# Patient Record
Sex: Female | Born: 1990 | Race: White | Hispanic: No | Marital: Single
Health system: Southern US, Community
[De-identification: ages and names within clinical notes are randomized; demographics above are authoritative.]

---

## 2011-05-05 ENCOUNTER — Emergency Department: Payer: Self-pay | Admitting: Emergency Medicine

## 2011-05-12 ENCOUNTER — Ambulatory Visit: Payer: Self-pay

## 2011-05-12 LAB — URINALYSIS, COMPLETE
Bilirubin,UR: NEGATIVE
Nitrite: NEGATIVE
Protein: NEGATIVE

## 2011-05-12 LAB — CBC WITH DIFFERENTIAL/PLATELET
Basophil #: 0 10*3/uL (ref 0.0–0.1)
HCT: 39.7 % (ref 35.0–47.0)
HGB: 12.8 g/dL (ref 12.0–16.0)
Lymphocyte #: 3 10*3/uL (ref 1.0–3.6)
Lymphocyte %: 22.9 %
Monocyte #: 0.7 x10 3/mm (ref 0.2–0.9)
WBC: 12.9 10*3/uL — ABNORMAL HIGH (ref 3.6–11.0)

## 2011-05-12 LAB — BASIC METABOLIC PANEL
Anion Gap: 10 (ref 7–16)
Calcium, Total: 9.3 mg/dL (ref 9.0–10.7)
Chloride: 101 mmol/L (ref 98–107)
Co2: 27 mmol/L (ref 21–32)
Osmolality: 276 (ref 275–301)
Sodium: 138 mmol/L (ref 136–145)

## 2011-05-12 LAB — LIPASE, BLOOD: Lipase: 195 U/L (ref 73–393)

## 2011-05-14 LAB — URINE CULTURE

## 2011-06-26 ENCOUNTER — Emergency Department: Payer: Self-pay | Admitting: Emergency Medicine

## 2011-06-26 LAB — COMPREHENSIVE METABOLIC PANEL
Anion Gap: 10 (ref 7–16)
BUN: 17 mg/dL (ref 7–18)
Creatinine: 0.75 mg/dL (ref 0.60–1.30)
EGFR (African American): >60
EGFR (Non-African Amer.): >60
Glucose: 118 mg/dL — ABNORMAL HIGH (ref 65–99)
Osmolality: 282 (ref 275–301)
SGOT(AST): 30 U/L — ABNORMAL HIGH (ref 0–26)
Sodium: 140 mmol/L (ref 136–145)

## 2011-06-26 LAB — URINALYSIS, COMPLETE
Bilirubin,UR: NEGATIVE
Blood: NEGATIVE
Ph: 5 (ref 4.5–8.0)
RBC,UR: 4 /HPF (ref 0–5)
Specific Gravity: 1.026 (ref 1.003–1.030)
WBC UR: 3 /HPF (ref 0–5)

## 2011-06-26 LAB — CBC
HCT: 36.6 % (ref 35.0–47.0)
HGB: 11.5 g/dL — ABNORMAL LOW (ref 12.0–16.0)
MCH: 24.6 pg — ABNORMAL LOW (ref 26.0–34.0)
MCHC: 31.5 g/dL — ABNORMAL LOW (ref 32.0–36.0)
MCV: 78 fL — ABNORMAL LOW (ref 80–100)
Platelet: 359 10*3/uL (ref 150–440)
RBC: 4.68 10*6/uL (ref 3.80–5.20)
RDW: 14.7 % — ABNORMAL HIGH (ref 11.5–14.5)
WBC: 17.6 10*3/uL — ABNORMAL HIGH (ref 3.6–11.0)

## 2011-06-26 LAB — HCG, QUANTITATIVE, PREGNANCY: Beta Hcg, Quant.: <1 m[IU]/mL — ABNORMAL LOW

## 2011-06-27 ENCOUNTER — Emergency Department: Payer: Self-pay | Admitting: Emergency Medicine

## 2011-09-16 ENCOUNTER — Ambulatory Visit: Payer: Self-pay | Admitting: Family Medicine

## 2011-10-19 ENCOUNTER — Emergency Department: Payer: Self-pay | Admitting: Emergency Medicine

## 2011-10-19 LAB — CBC
HCT: 37.9 % (ref 35.0–47.0)
HGB: 12.6 g/dL (ref 12.0–16.0)
MCH: 26.3 pg (ref 26.0–34.0)
MCHC: 33.3 g/dL (ref 32.0–36.0)
MCV: 79 fL — ABNORMAL LOW (ref 80–100)
Platelet: 314 10*3/uL (ref 150–440)

## 2011-10-20 LAB — URINALYSIS, COMPLETE
Bilirubin,UR: NEGATIVE
Blood: NEGATIVE
Glucose,UR: NEGATIVE mg/dL (ref 0–75)
Leukocyte Esterase: NEGATIVE
Nitrite: NEGATIVE
Ph: 5 (ref 4.5–8.0)
Protein: NEGATIVE
WBC UR: 1 /HPF (ref 0–5)

## 2011-10-20 LAB — COMPREHENSIVE METABOLIC PANEL
Albumin: 3.3 g/dL — ABNORMAL LOW (ref 3.8–5.6)
Anion Gap: 9 (ref 7–16)
Bilirubin,Total: 0.2 mg/dL (ref 0.2–1.0)
Co2: 24 mmol/L (ref 21–32)
Creatinine: 0.51 mg/dL — ABNORMAL LOW (ref 0.60–1.30)
Glucose: 96 mg/dL (ref 65–99)
Potassium: 3.4 mmol/L — ABNORMAL LOW (ref 3.5–5.1)
SGPT (ALT): 15 U/L (ref 12–78)
Sodium: 138 mmol/L (ref 136–145)
Total Protein: 7.4 g/dL (ref 6.4–8.6)

## 2011-10-20 LAB — HCG, QUANTITATIVE, PREGNANCY: Beta Hcg, Quant.: 52265 m[IU]/mL — ABNORMAL HIGH

## 2012-03-30 ENCOUNTER — Observation Stay: Payer: Self-pay | Admitting: Obstetrics and Gynecology

## 2012-03-30 LAB — PIH PROFILE
Anion Gap: 4 — ABNORMAL LOW (ref 7–16)
BUN: 9 mg/dL (ref 7–18)
Calcium, Total: 8.3 mg/dL — ABNORMAL LOW (ref 8.5–10.1)
Creatinine: 0.73 mg/dL (ref 0.60–1.30)
EGFR (African American): >60
EGFR (Non-African Amer.): >60
HCT: 34.6 % — ABNORMAL LOW (ref 35.0–47.0)
HGB: 11.4 g/dL — ABNORMAL LOW (ref 12.0–16.0)
MCH: 27.9 pg (ref 26.0–34.0)
MCV: 85 fL (ref 80–100)
Potassium: 3.8 mmol/L (ref 3.5–5.1)
SGOT(AST): 13 U/L — ABNORMAL LOW (ref 15–37)
Sodium: 138 mmol/L (ref 136–145)
Uric Acid: 6.1 mg/dL — ABNORMAL HIGH (ref 2.6–6.0)
WBC: 12.5 10*3/uL — ABNORMAL HIGH (ref 3.6–11.0)

## 2012-03-30 LAB — PROTEIN / CREATININE RATIO, URINE
Creatinine, Urine: 206.1 mg/dL — ABNORMAL HIGH (ref 30.0–125.0)
Protein, Random Urine: 56 mg/dL — ABNORMAL HIGH (ref 0–12)
Protein/Creat. Ratio: 272 mg/gCREAT — ABNORMAL HIGH (ref 0–200)

## 2012-04-01 ENCOUNTER — Observation Stay: Payer: Self-pay | Admitting: Obstetrics & Gynecology

## 2012-04-01 LAB — PROTEIN, URINE, 24 HOUR
Protein, 24 Hour Urine: 472 mg/24HR — ABNORMAL HIGH (ref 30–149)
Protein, Urine: 39 mg/dL (ref 0–12)
Total Volume: 1210 mL

## 2012-04-03 ENCOUNTER — Observation Stay: Payer: Self-pay | Admitting: Obstetrics & Gynecology

## 2012-04-05 LAB — PROTEIN, URINE, 24 HOUR
Collection Hours: 24 hours
Protein, 24 Hour Urine: 533 mg/24HR — ABNORMAL HIGH (ref 30–149)
Protein, Urine: 48 mg/dL (ref 0–12)
Total Volume: 1110 mL

## 2012-04-08 ENCOUNTER — Observation Stay: Payer: Self-pay | Admitting: Obstetrics and Gynecology

## 2012-04-22 ENCOUNTER — Inpatient Hospital Stay: Payer: Self-pay | Admitting: Obstetrics and Gynecology

## 2012-04-22 LAB — PIH PROFILE
Anion Gap: 8
BUN: 16 mg/dL
Calcium, Total: 8.5 mg/dL
Chloride: 106 mmol/L
Co2: 22 mmol/L
Creatinine: 0.56 mg/dL — ABNORMAL LOW
EGFR (African American): >60
EGFR (Non-African Amer.): >60
Glucose: 78 mg/dL
HCT: 35.9 %
HGB: 12.3 g/dL
MCH: 29.3 pg
MCHC: 34.3 g/dL
MCV: 85 fL
Osmolality: 272
Platelet: 271 x10 3/mm 3
Potassium: 4.3 mmol/L
RBC: 4.2 X10 6/mm 3
RDW: 14.1 %
SGOT(AST): 23 U/L
Sodium: 136 mmol/L
Uric Acid: 7 mg/dL — ABNORMAL HIGH
WBC: 14.1 x10 3/mm 3 — ABNORMAL HIGH

## 2012-04-23 LAB — HEMATOCRIT: HCT: 36.1 % (ref 35.0–47.0)

## 2012-08-28 ENCOUNTER — Ambulatory Visit: Payer: Self-pay | Admitting: Emergency Medicine

## 2013-06-16 ENCOUNTER — Ambulatory Visit: Payer: Self-pay

## 2014-04-20 IMAGING — US US OB < 14 WEEKS - US OB TV
1 series · 14 of 28 positions shown · non-contrast
Comparison: none

REASON FOR EXAM: CR [id number] 2-3 wks  preg positive  blood in Urine Abd
and pelvic pain
COMMENTS:

[Series 1: us ob < 14 weeks - us ob tv · 0.26mm/px · 39 acquisitions, 14 frames shown]
[im 2/39]
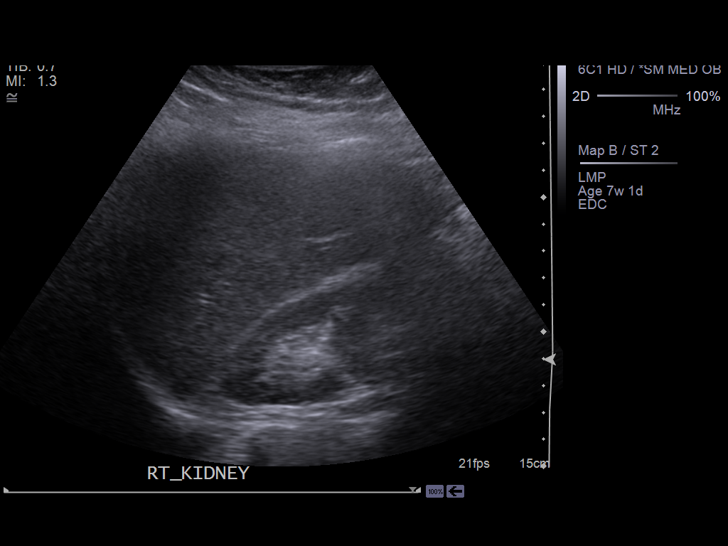
[im 5/39]
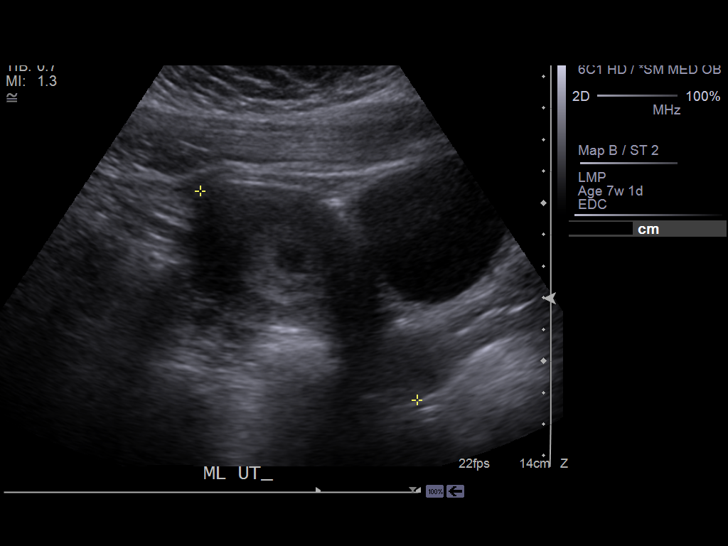
[im 8/39]
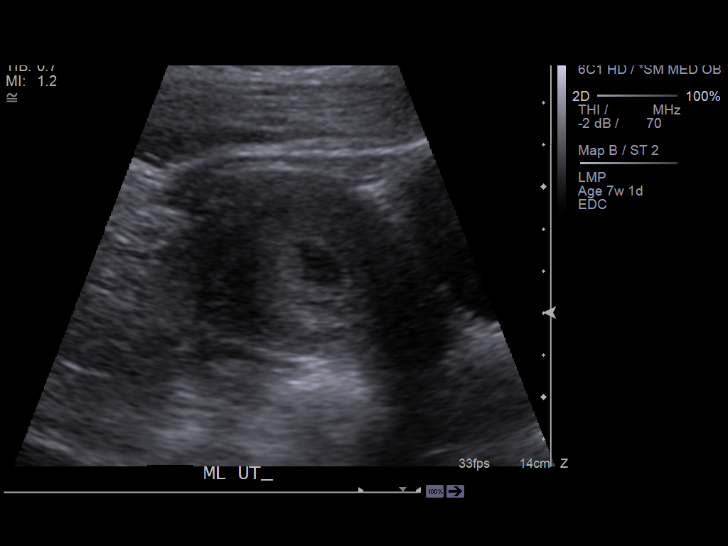
[im 10/39]
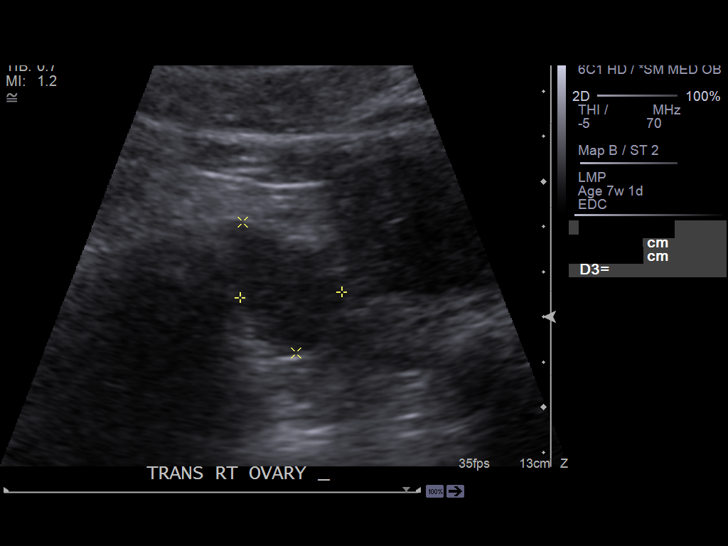
[im 13/39]
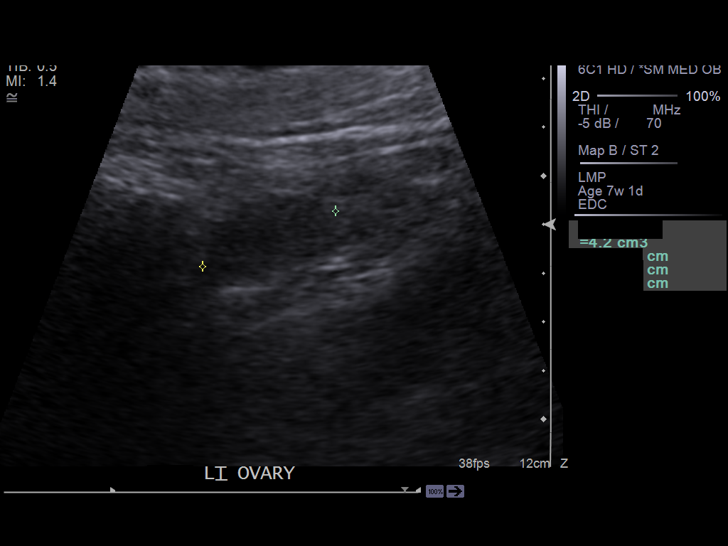
[im 16/39]
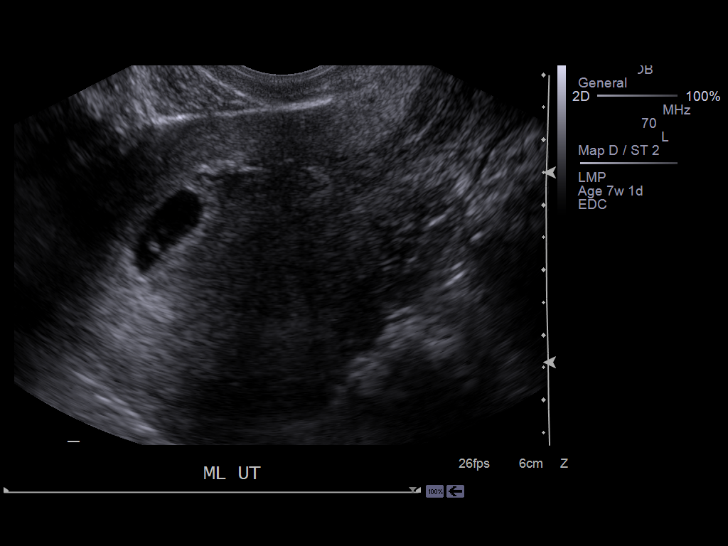
[im 19/39]
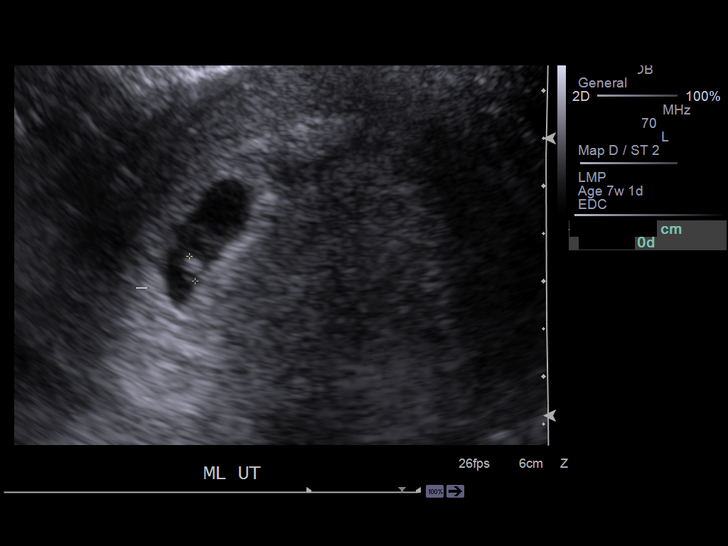
[im 22/39]
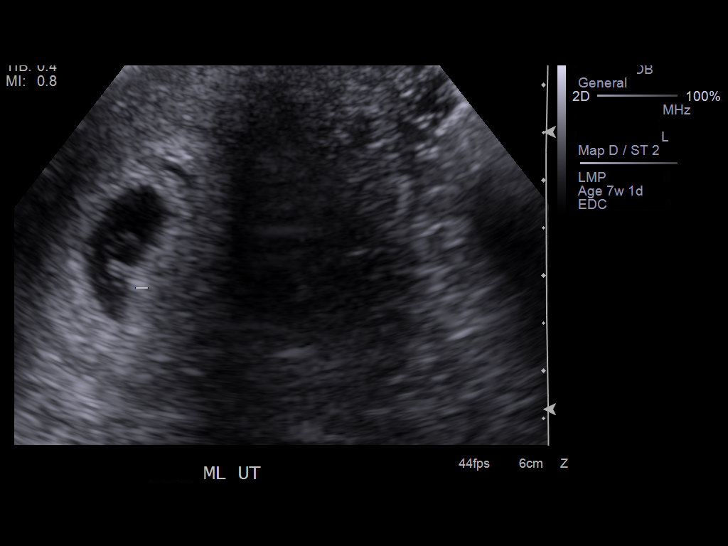
[im 24/39]
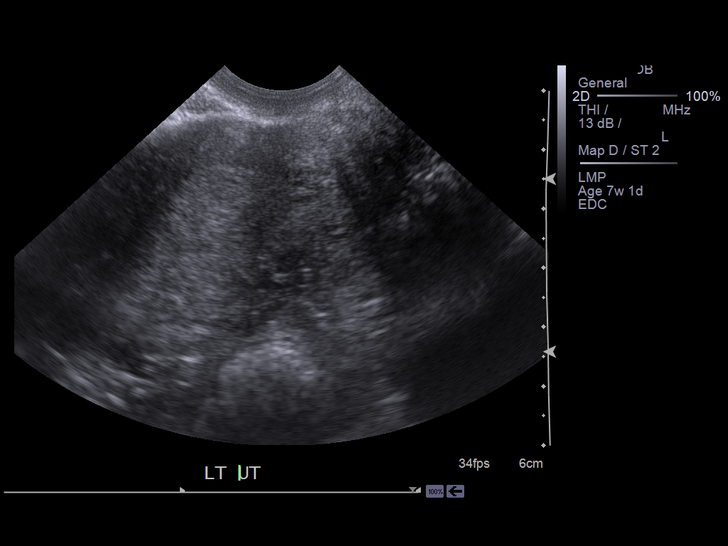
[im 27/39]
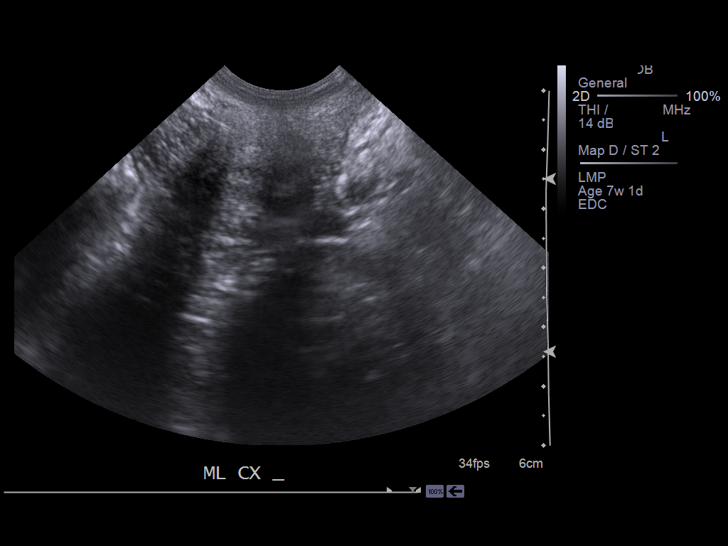
[im 30/39]
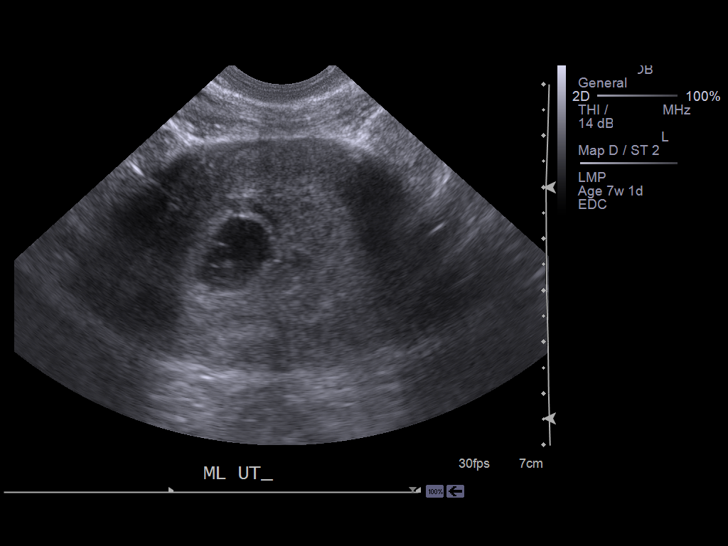
[im 33/39]
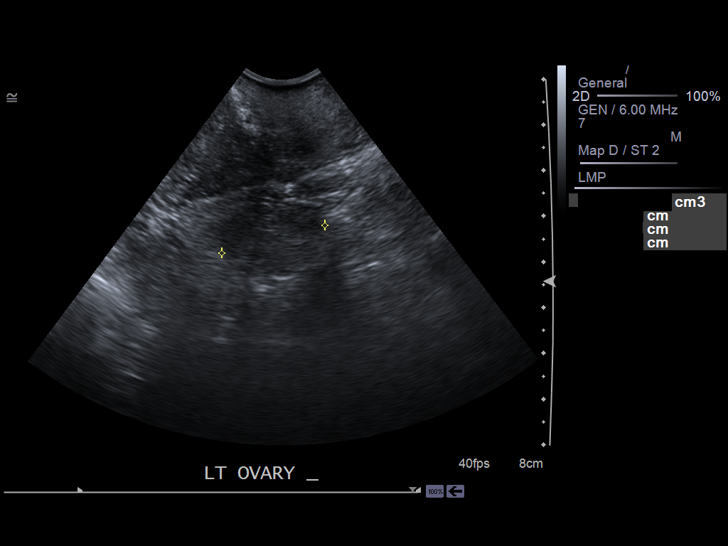
[im 36/39]
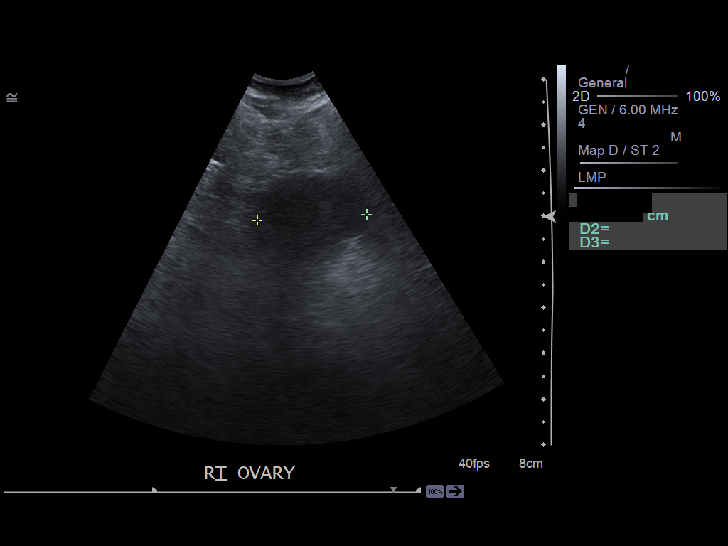
[im 39/39]
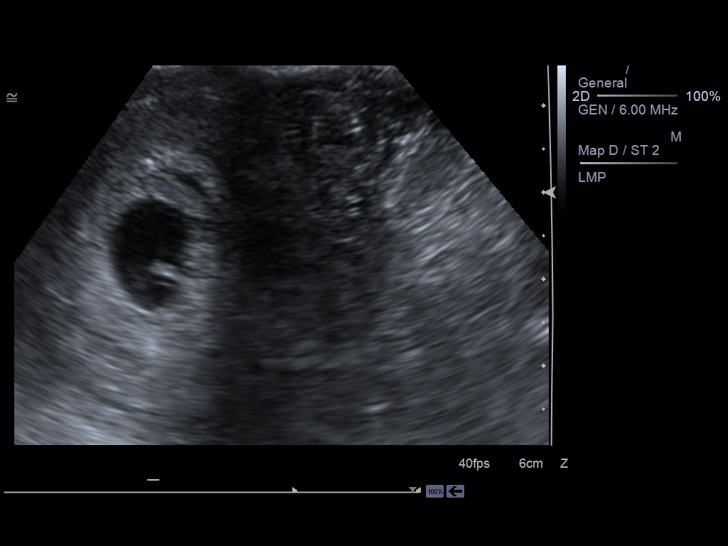

[14 of 28 positions shown; findings below may reference images not displayed]

PROCEDURE:     [facility] - [facility] OB LESS THAN 14 WEEKS W/TV  - September 16, 2011  [DATE]

RESULT:     A gravid uterus is present. The crown-rump length of the fetus
is 0.27 cm corresponding to a 5 week 0 day gestation. A yolk sac is visible.
The cardiac rate could not not be accurately measured but slow and likely is
in the 60 to 70 beats per minute range.

The maternal right ovary measures 2.4 x 1.9 x 1.4 cm. The left ovary
measures 1.4 x 2 x 2.3 centimeters. There is no abnormal fluid in the
adnexal regions.
IMPRESSION: There is an early IUP which appears to measure
approximately 5 weeks gestation. This would place the estimated date of
confinement at [DATE]. A slow cardiac rate was observed and could not be
accurately measured due to the tiny size of the fetal pole. Followup imaging
as well as serial beta-hCG determinations are recommended.

[REDACTED]

## 2014-05-19 NOTE — Op Note (Signed)
PATIENT NAME:  Julie Espinoza, Julie Espinoza MR#:  [id number] DATE OF BIRTH:  [birth year]  DATE OF PROCEDURE:  04/22/2012  PREOPERATIVE DIAGNOSIS:  1.  Term intrauterine pregnancy at 37 weeks 0 days gestation.  2.  Mild preeclampsia.  3.  Nonreassuring fetal surveillance.   POSTOPERATIVE DIAGNOSIS: 1.  Term intrauterine pregnancy at 37 weeks 0 days gestation.  2.  Mild preeclampsia.  3.  Nonreassuring fetal surveillance.  4.  Intrauterine growth restriction.   PROCEDURE PERFORMED: Primary low transverse cesarean section via Pfannenstiel skin incision.   ANESTHESIA:  Spinal.   PRIMARY SURGEON:  Vena AustriaAndreas Kenyotta Dorfman, MD  ESTIMATED BLOOD LOSS: 700 mL   OPERATIVE FLUIDS: 1200 of crystalloid.   COMPLICATIONS: None.   INTRAOPERATIVE FINDINGS: Normal tubes, ovaries and uterus, small for gestational age fetus with extremely small placenta which was sent to pathology.  Delivery resulted in the birth of a liveborn female infant weighing 1820 and 4 pounds 0 ounces, Apgar scores of 8 and 9. Cord pH 7.06 with CO2 of 72, bicarb Pfannenstiel skin incision 20.4 and a base excess of -11. The patient initially presented for induction secondary to mild preeclampsia, however, on presentation the fetus displayed minimal variability with what appeared to be recurrent late decelerations at which point the decision was made for her to forego induction and proceed with primary low transverse C-section for delivery. Antibiotics: 2 grams of Ancef. Drains or tubes: Foley to gravity drainage, On-Q catheter system.   SPECIMENS REMOVED: Placenta.   COMPLICATIONS: None. The patient condition following procedure: Stable.   PROCEDURE IN DETAIL: Risks, benefits and alternatives of the procedure including attempts at induction were discussed with the patient in light of the fetal heart changes that were noted. The decision was made to proceed with primary low transverse C-section for delivery. The patient was taken to the operating  room where spinal anesthesia was administered. The patient was positioned in the supine position, prepped and draped in the usual sterile fashion. Timeout was performed. A Pfannenstiel skin incision was made 2 cm above the pubic symphysis, carried down sharply to the level of the rectus fascia.  The fascia was incised in the midline using the knife. The fascial incision was extended using Mayo scissors. The superior border of the rectus fascia was grasped with 2 Coker clamps. The underlying rectus muscles were dissected off the rectus fascia using blunt dissection and the median raphe was then incised using Mayo scissors. This was then repeated for the inferior border of the rectus fascia in a similar manner. Midline was identified and the peritoneum was entered bluntly. The peritoneal incision was extended using manual traction. A bladder blade was then placed. A bladder flap was created using Metzenbaum scissors and then developed digitally. The bladder blade was then replaced displacing the bladder caudad. The hysterotomy incision was then made, low transverse, on the uterus. The uterus was entered bluntly using the operator's finger. Upon placing the operator's hand into the hysterotomy, the infant was noted to be in the OA position. The vertex was grasped, flexed, brought to the incision, and delivered with the help of a vacuum. The remainder of the body delivered with ease. The cord was clamped and cut. The infant was passed to the awaiting pediatrician. Cord gas as well as cord blood was obtained. The placenta was delivered using manual extraction.   The uterus was exteriorized and wiped clean of clots and debris using 2 moist laps. The hysterotomy incision repaired using a 2-layer closure of 0 Vicryl. The  uterus was returned to the abdomen. There was a portion of the left hysterotomy which had some bleeding; it was oversewn with a single figure-of-eight. This achieved excellent hemostasis.  The peritoneal  gutters were then wiped clean of clots and debris using 2 moist laps. The superior border of the rectus fascia was grasped with Coker clamps. The On-Q catheters were placed subfascially and the fascia was then closed using a #1 looped PDS in a running fashion. The subcutaneous tissue was irrigated. Skin was closed using Insorb staples. The incision and the On-Q catheters were then dressed with Dermabond and each On-Q catheter was bolused with 5 mL of 0.5% bupivacaine each. Sponge, needle and instrument counts were correct x 2. The patient tolerated the procedure well and was taken to the recovery room in stable condition.     ____________________________ Florina Ou. Bonney Aid, MD ams:ct D: 04/24/2012 11:16:05 ET T: 04/24/2012 11:43:28 ET JOB#: [id number]  cc: Florina Ou. Bonney Aid, MD, <Dictator> Lorrene Reid MD ELECTRONICALLY SIGNED 04/27/2012 13:18

## 2014-05-24 IMAGING — US US OB < 14 WEEKS
1 series · 14 of 28 positions shown · non-contrast
Comparison: none

REASON FOR EXAM: 10 weeks pregnant, lower abd pain, eval
COMMENTS:

[Series 1: us ob < 14 weeks · 0.25mm/px · 14 of 29 slices shown]
[im 2/29]
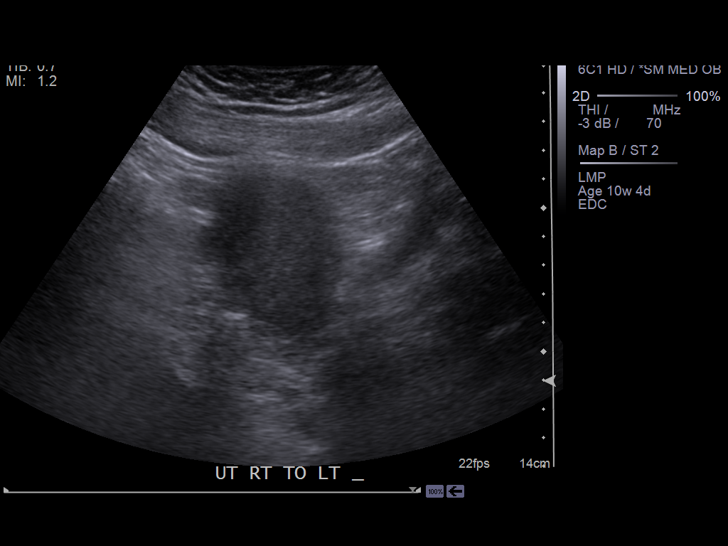
[im 4/29]
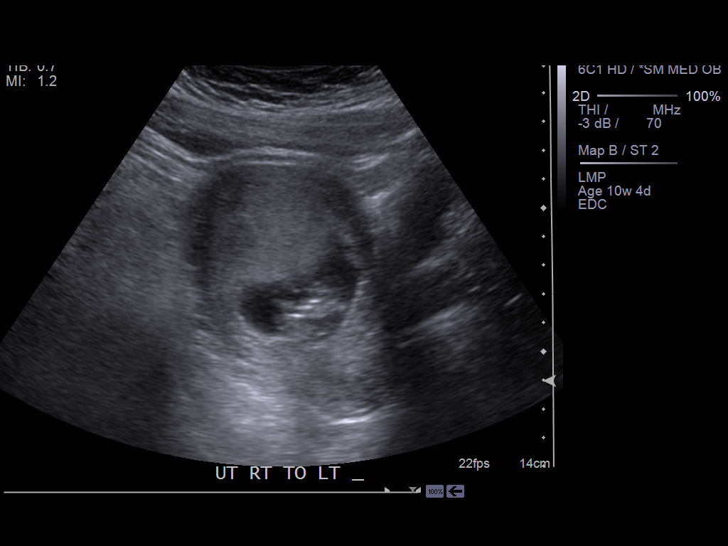
[im 6/29]
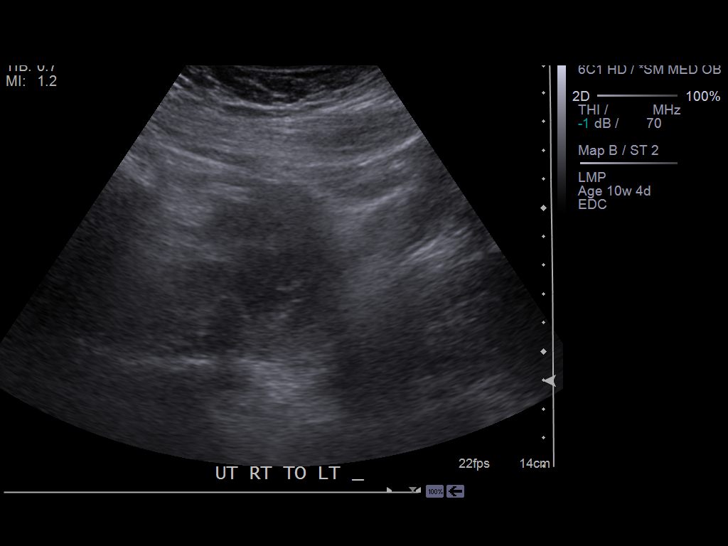
[im 8/29]
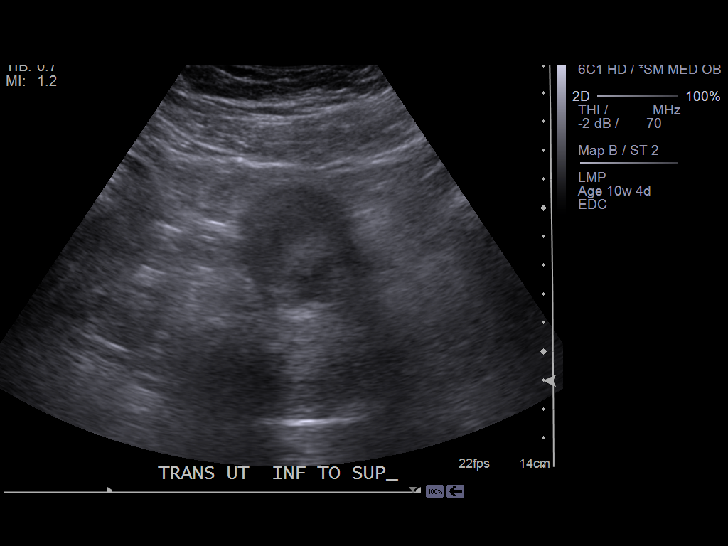
[im 10/29]
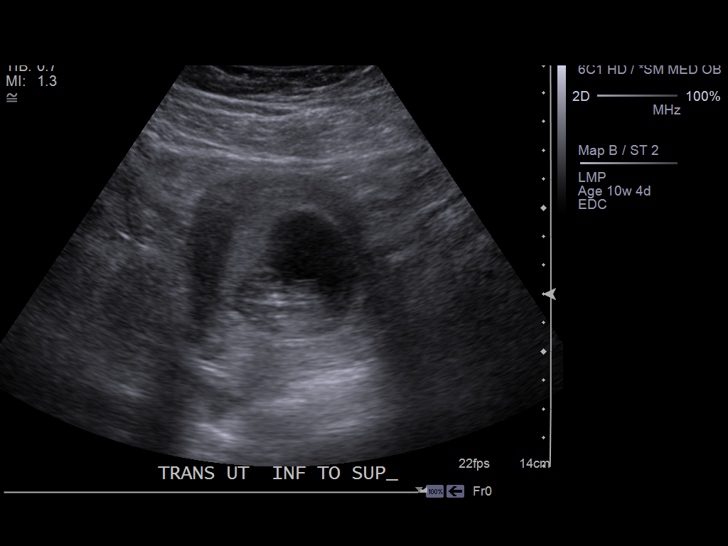
[im 12/29]
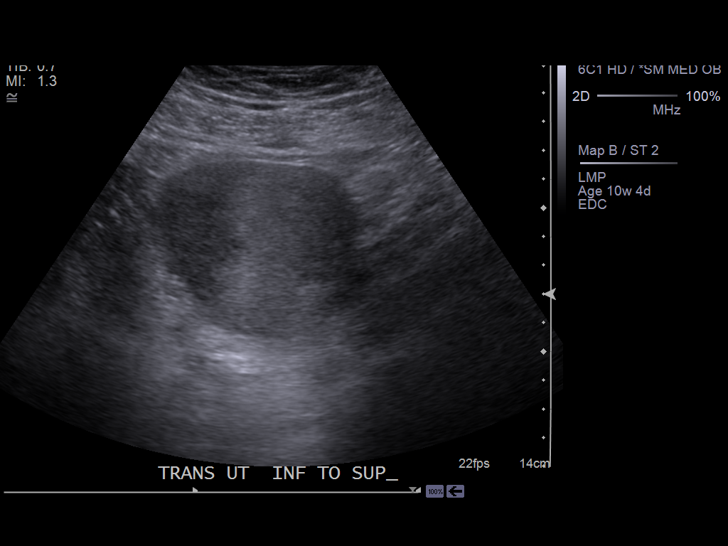
[im 14/29]
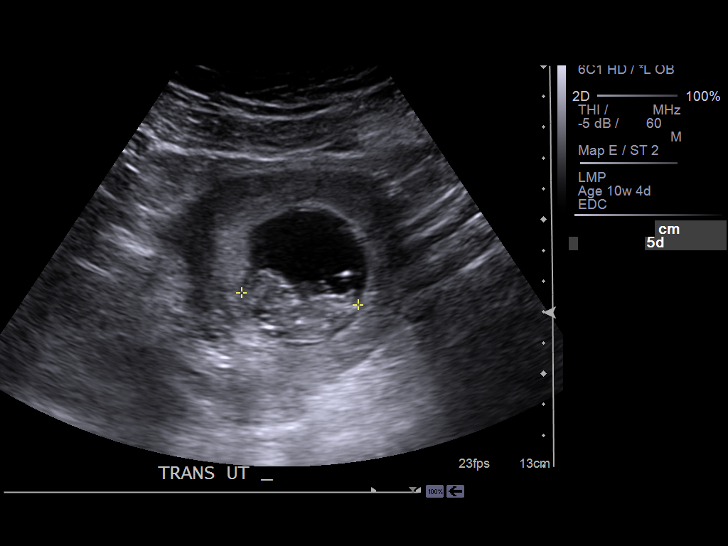
[im 16/29]
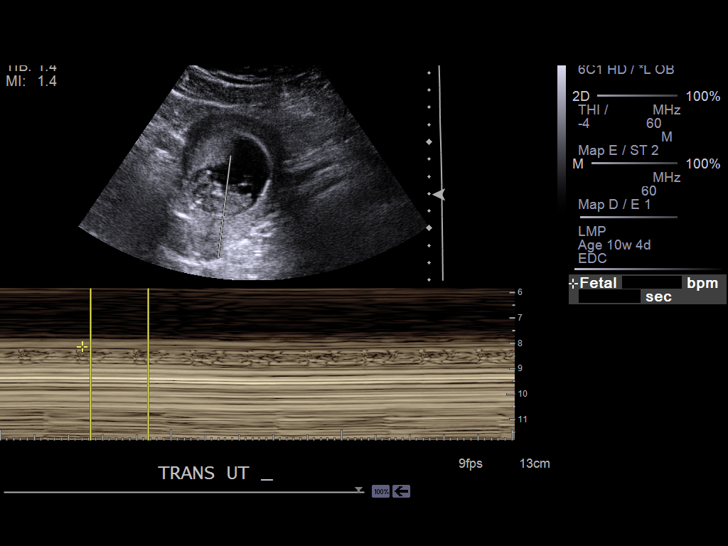
[im 18/29]
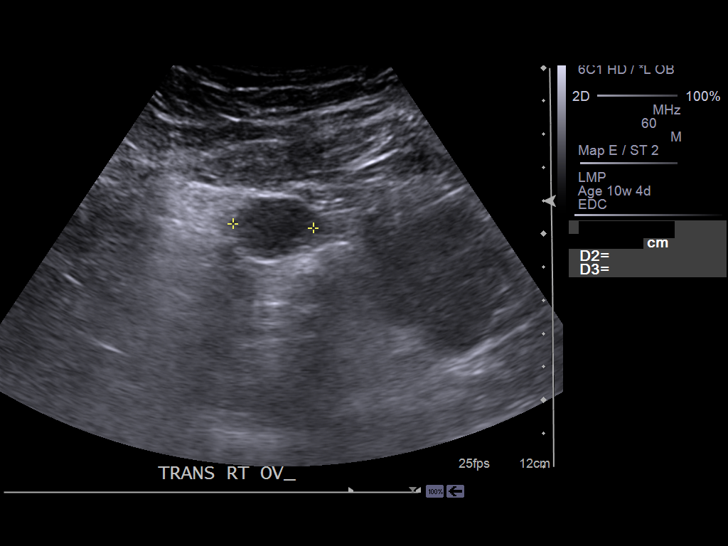
[im 20/29]
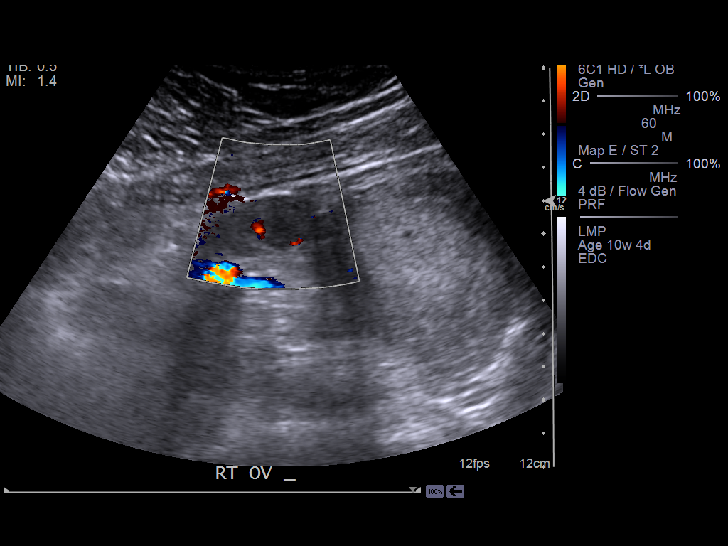
[im 22/29]
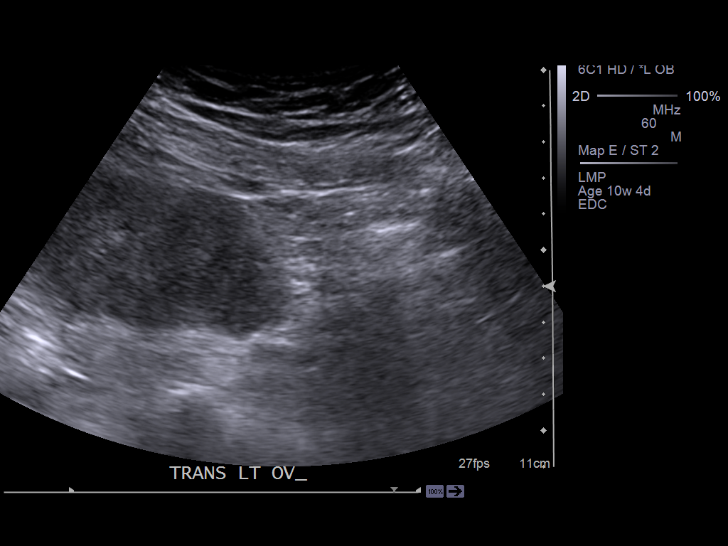
[im 24/29]
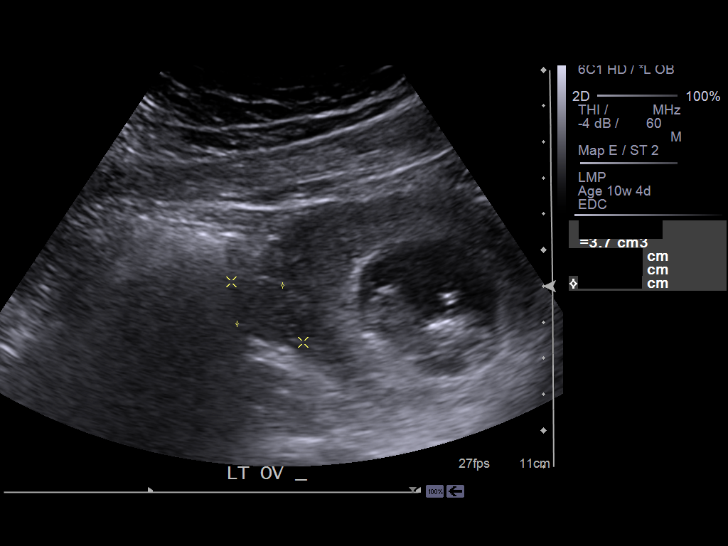
[im 26/29]
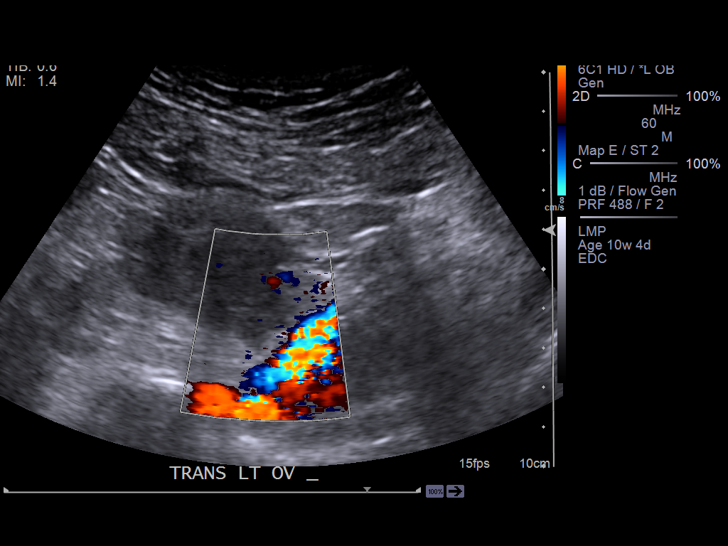
[im 29/29]
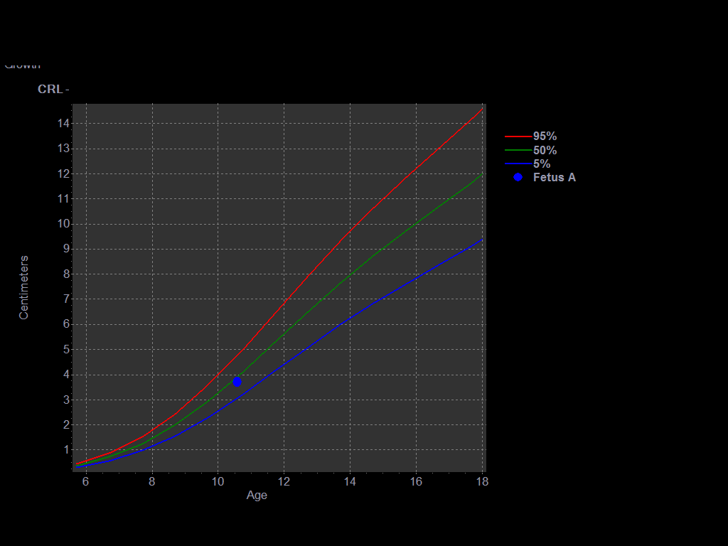

[14 of 28 positions shown; findings below may reference images not displayed]

PROCEDURE:     US  - US OB LESS THAN 14 WEEKS  - October 20, 2011  [DATE]

RESULT:     Trans- abdominal imaging was performed. There is a gravid
uterus. The crown-rump length of the fetus measures 3.7 1 cm corresponding
to a 10 week 4 day gestation. A fetal cardiac rate of 178 beats per minute
was demonstrated. The yolk sac was not demonstrated. There is no evidence of
a subchorionic hemorrhage.

The maternal ovaries are normal in echotexture and vascularity. The right
ovary measures 3.6 cm in greatest dimension the left ovary 2.6 cm in
greatest dimension. Survey views of the maternal kidneys reveal no
hydronephrosis.
IMPRESSION: There is a viable IUP with estimated gestational age of 10
weeks 4 days plus or minus approximately 7 days. The estimated date of
confinement is May 13, 2012.

A preliminary report was sent to the [HOSPITAL] the conclusion
of the study.

## 2014-06-06 NOTE — H&P (Signed)
L&D Evaluation:  History Expanded:  HPI 24 yo G1 at 4234 0/7 wks, EDD of 05/13/12 by LMP and 1st trim US, presents to L&D to turn in 24 hr urine and NST. Pt evaluated 2 days ago for elevated BP's in office. HELLP labs WNL, PC Ratio 272. [facility], asthma, obesity.  Notes positive fetal movement, denies contractions, leakage of fluid, and vaginal bleeding.  Denies headaches or visual changes today.   Blood Type (Maternal) A positive   Group B Strep Results Maternal (Result >5wks must be treated as unknown) unknown/result > 5 weeks ago   Maternal HIV Negative   Maternal Syphilis Ab Nonreactive   Maternal Varicella Immune   Rubella Results (Maternal) immune   Maternal T-Dap Immune   Patient's Medical History Asthma  ADHD, obesity   Patient's Surgical History Appendectomy  T&A, PE tubes   Medications Pre Natal Vitamins  ProAir HFA   Allergies Sulfa   Social History none   Family History Non-Contributory   ROS:  ROS see HPI   Exam:  Vital Signs 140s/90s while in semi-fowlers, 120s/60s-80s on side-lying   Urine Protein 24 hr urine: 472   General no apparent distress   Mental Status clear   Chest clear   Heart no murmur/gallop/rubs   Abdomen gravid, non-tender   Back no CVAT   Edema trace edema   Reflexes 2+   Clonus negative   Pelvic deferred   Mebranes Intact   FHT normal rate with no decels, Category 1 tracing   FHT Description 125/mod var/+accels/no decels   Ucx absent   Skin dry   Impression:  Impression reactive NST, IUP at 34 weeks with gestational hypertension relieved with rest   Plan:  Plan EFM/NST   Comments - Return to L&D on 3/8 for NST and repeat labs, BP check - Encouraged bed rest at this time. Pt had plans to travel out of town tomorrow for baby shower. Advised against travel at this time.   - Preeclampsia precautions.   Follow Up Appointment 3/8 in L&D, [facility]   Electronic  Signatures: Vella KohlerBrothers, Tamara K (CNM)  (Signed 06-Mar-14 12:21)  Authored: L&D Evaluation   Last Updated: 06-Mar-14 12:21 by Vella KohlerBrothers, Tamara K (CNM)

## 2014-06-06 NOTE — H&P (Signed)
L&D Evaluation:  History:  HPI 24 yo G1 at 6754w0d gestation with mild pre-eclampsia based on mild range BP's and 24-hr urine result of 470mg  of protein.  At last clinic visit patient was normotensive with normal AFI and reactive NST. Given option for continued surveillance and expectant mangement vs IOL given unfavorable cervix.  Patient opted to proceed with IOL.   Presents with IOL - mild preE   Patient's Medical History No Chronic Illness   Patient's Surgical History none   Medications Pre Natal Vitamins   Allergies Sulfa   Social History none   Family History Non-Contributory   ROS:  ROS All systems were reviewed.  HEENT, CNS, GI, GU, Respiratory, CV, Renal and Musculoskeletal systems were found to be normal.   Exam:  Vital Signs 142/80   Urine Protein not completed   General no apparent distress   Mental Status clear   Chest no increased work of breathing   Abdomen gravid, non-tender   Estimated Fetal Weight Average for gestational age, vtx   Edema no edema   Pelvic cervix closed and thick, based on clinic check 3/24   Mebranes Intact   FHT 145, min to moderate, no accles, two subtle dips in first 20 minutes of monitoring   Ucx absent   Impression:  Impression IOL at 3354w0d for preE   Plan:  Plan EFM/NST, monitor BP, PIH panel   Electronic Signatures: Lorrene ReidStaebler, Noach Calvillo M (MD)  (Signed 27-Mar-14 20:26)  Authored: L&D Evaluation   Last Updated: 27-Mar-14 20:26 by Lorrene ReidStaebler, Aylinn Rydberg M (MD)

## 2014-06-06 NOTE — H&P (Signed)
L&D Evaluation:  History:  HPI 24 yo G1 at 118w5d  gestational age by 12 wk ultrasound, pregnancy complicated by asthma, obesity.  Presented to office today for routine OB visit.  BPs elevated (130/92, 132/90), urine dip 1+ protein.  Notes positive fetal movement, denies contractions, leakage of fluid, and vaginal bleeding.  Has had intermittent headaches, none currently (not new to her medical history), states that lately has had spots in her vision (not consistent), and denies RUQ pain.   Patient's Medical History Asthma  ADHD   Patient's Surgical History Appendectomy  T&A, PE tubes   Medications Pre Natal Vitamins  ProAir HFA   Allergies Sulfa   Social History none   Family History Non-Contributory   ROS:  ROS All systems were reviewed.  HEENT, CNS, GI, GU, Respiratory, CV, Renal and Musculoskeletal systems were found to be normal., unless noted in HPI   Exam:  Vital Signs 135-154/63-104   Urine Protein 1+, UPC ratio 272   General no apparent distress   Mental Status clear   Chest clear   Heart normal sinus rhythm   Abdomen gravid, non-tender   Back no CVAT   Edema no edema   FHT normal rate with no decels   FHT Description 125/mod var/+accels/no decels   Ucx absent   Skin no lesions   Impression:  Impression reactive NST, 1) possible gestational hypertension, 2) Intrauterine pregnancy at 35 weeks   Plan:  Plan EFM/NST   Comments - Collect 24h urine tomorrow - HELLP labs wnl. - Return to L&D in AM to return urine and have NST with cycling of BPs. - patient states that she was heading out of town after her appointment today and was returning on Saturday. SHe was going to her mother's house, which is a 5-hour drive away.  She has a baby shower on Friday.  Discussed having her, instead, return to L&D on Thursday 3/6 with 24h urine collection, having NST and cycling BPs. If she is clnically stable, then the provider whom she sees on L&D could discuss with her  whether traveling is advisable for a 2-day trip.   - Gave preeclampsia precautions.  all questions answered. -NST reactive.   Follow Up Appointment 2 days on L&D, then in clinic if still clincally stable   Electronic Signatures: Conard NovakJackson, Stephen D (MD)  (Signed 04-Mar-14 17:12)  Authored: L&D Evaluation   Last Updated: 04-Mar-14 17:12 by Conard NovakJackson, Stephen D (MD)

## 2014-06-06 NOTE — H&P (Signed)
L&D Evaluation:  History Expanded:  HPI 24 yo G1 at 1834 0/7 wks, EDD of 05/13/12 by LMP and 1st trim US, presents to L&D to check BP and NST. Pt evaluated 4 days ago for elevated BP's in office, w HELLP labs WNL, PC Ratio 272. Then 2 days ago had 24 hour urinw protein 472.  [facility] to care, asthma, obesity.  Notes positive fetal movement, denies contractions, leakage of fluid, and vaginal bleeding.  Denies headaches or visual changes today.   Blood Type (Maternal) A positive   Group B Strep Results Maternal (Result >5wks must be treated as unknown) unknown/result > 5 weeks ago   Maternal HIV Negative   Maternal Syphilis Ab Nonreactive   Maternal Varicella Immune   Rubella Results (Maternal) immune   Maternal T-Dap Immune   Patient's Medical History Asthma  ADHD, obesity   Patient's Surgical History Appendectomy  T&A, PE tubes   Medications Pre Natal Vitamins  ProAir HFA   Allergies Sulfa   Social History none   Family History Non-Contributory   ROS:  ROS see HPI   Exam:  Vital Signs 120-130s/70-80s.   Urine Protein 2+   General no apparent distress   Mental Status clear   Chest clear   Heart no murmur/gallop/rubs   Abdomen gravid, non-tender   Edema trace edema   Reflexes 2+   Clonus negative   Pelvic deferred   FHT normal rate with no decels, Category 1 tracing   FHT Description 125/mod var/+accels/no decels   Ucx absent   Skin dry   Impression:  Impression reactive NST, IUP at 34+ weeks with gestational hypertension relieved with rest   Plan:  Plan EFM/NST, Start 24 hour urine collection and maintain modified bed rest precautions.   Comments Patient is counseled at length about the risks and consequences of Pregnancy Induced Hypertension, especially as it pertains to the development of preclampsia.  Risks of preclampsia include maternal and fetal complications, usually necessitating active delivery planning.   Physical exam findings, fetal heart rate monitoring, and laboratory findings are utilized together to determine the presence and severity of any gestational hypertension disorder.  Further management will be based on these findings.  Continued bed rest and frequent follow-up for blood pressure checks and symptom evaluation is of utmost importance for the remainder of pregnancy.   [facility]   Electronic Signatures: Letitia LibraHarris, Robert Paul (MD)  (Signed 08-Mar-14 15:18)  Authored: L&D Evaluation   Last Updated: 08-Mar-14 15:18 by Letitia LibraHarris, Robert Paul (MD)

## 2015-04-02 IMAGING — CR DG CHEST 2V
1 series · 2 of 2 positions shown · non-contrast
Comparison: none

REASON FOR EXAM: middle back pain increase with breathing
COMMENTS:

PROCEDURE:     MDR - MDR CHEST PA(OR AP) AND LATERAL  - August 28, 2012  [DATE]
RESULT:     The lungs are clear. The cardiac silhouette and visualized bony
skeleton are unremarkable.

[Series 1: pa · 0.17mm/px · 2 of 2 slices shown]
[im 1/2]
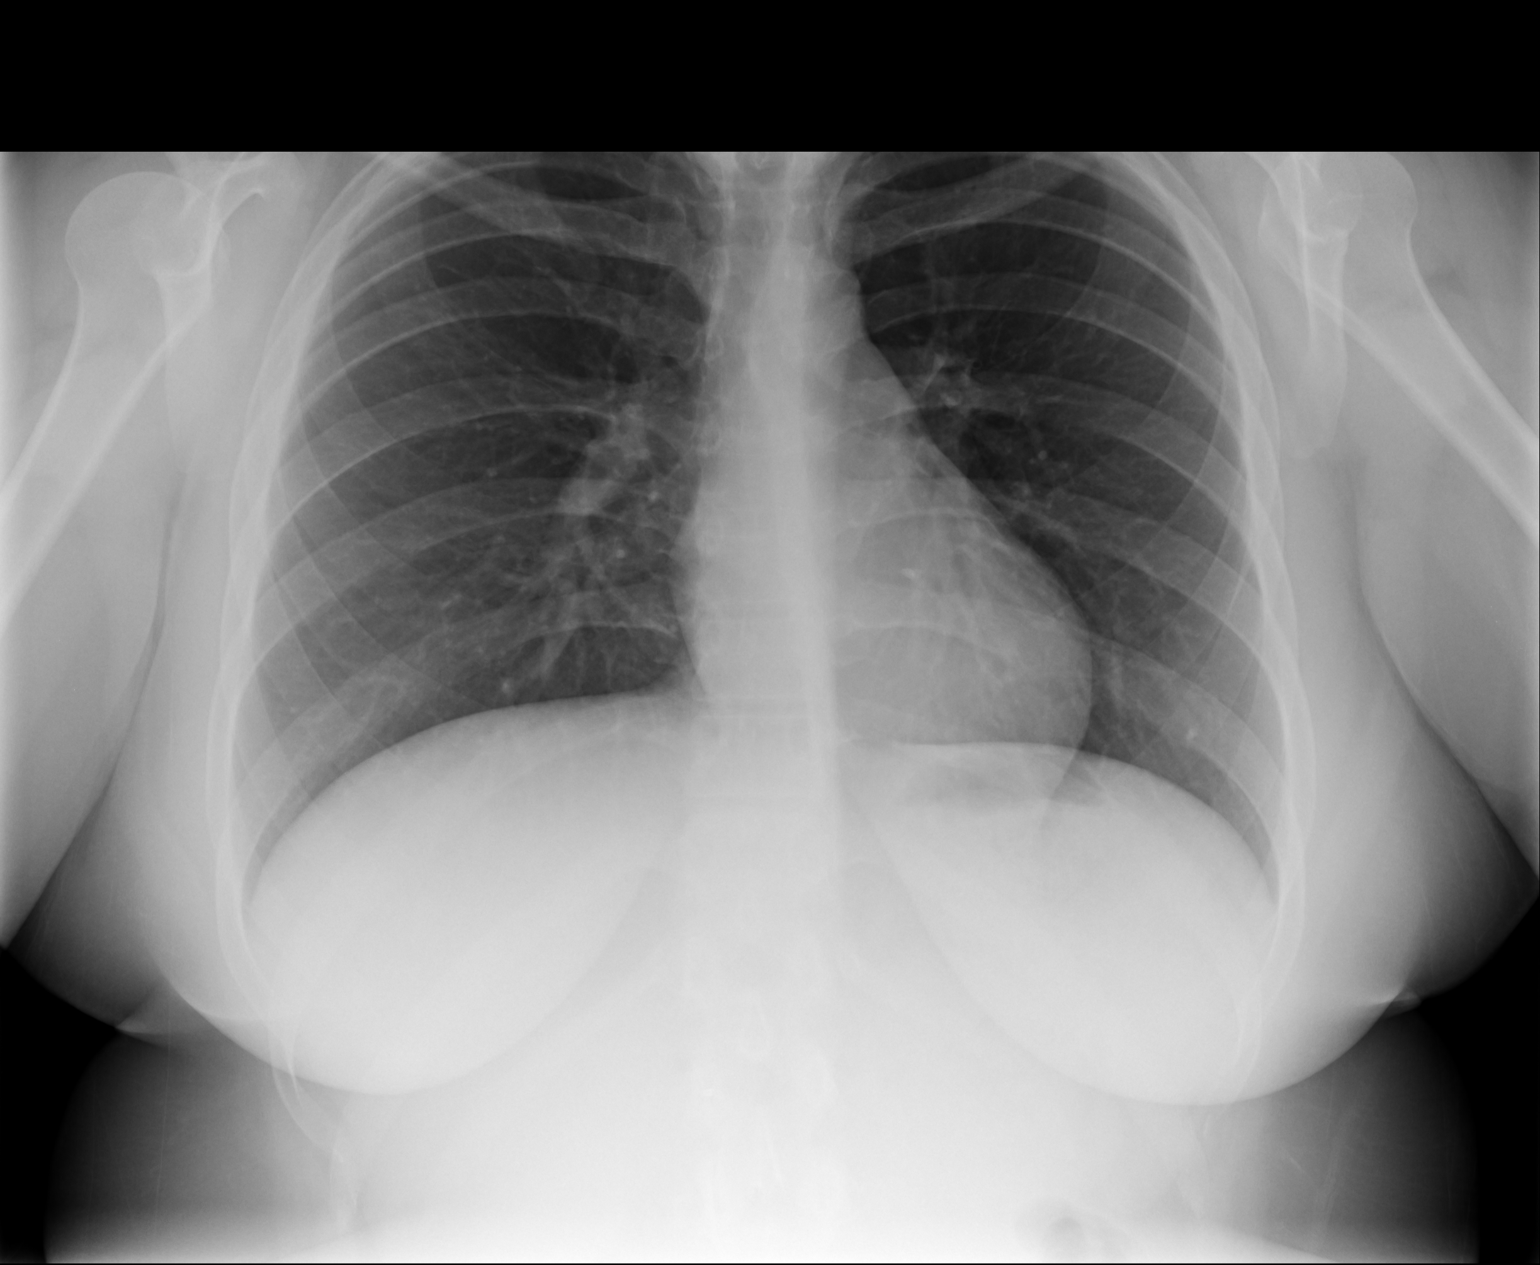
[im 2/2]
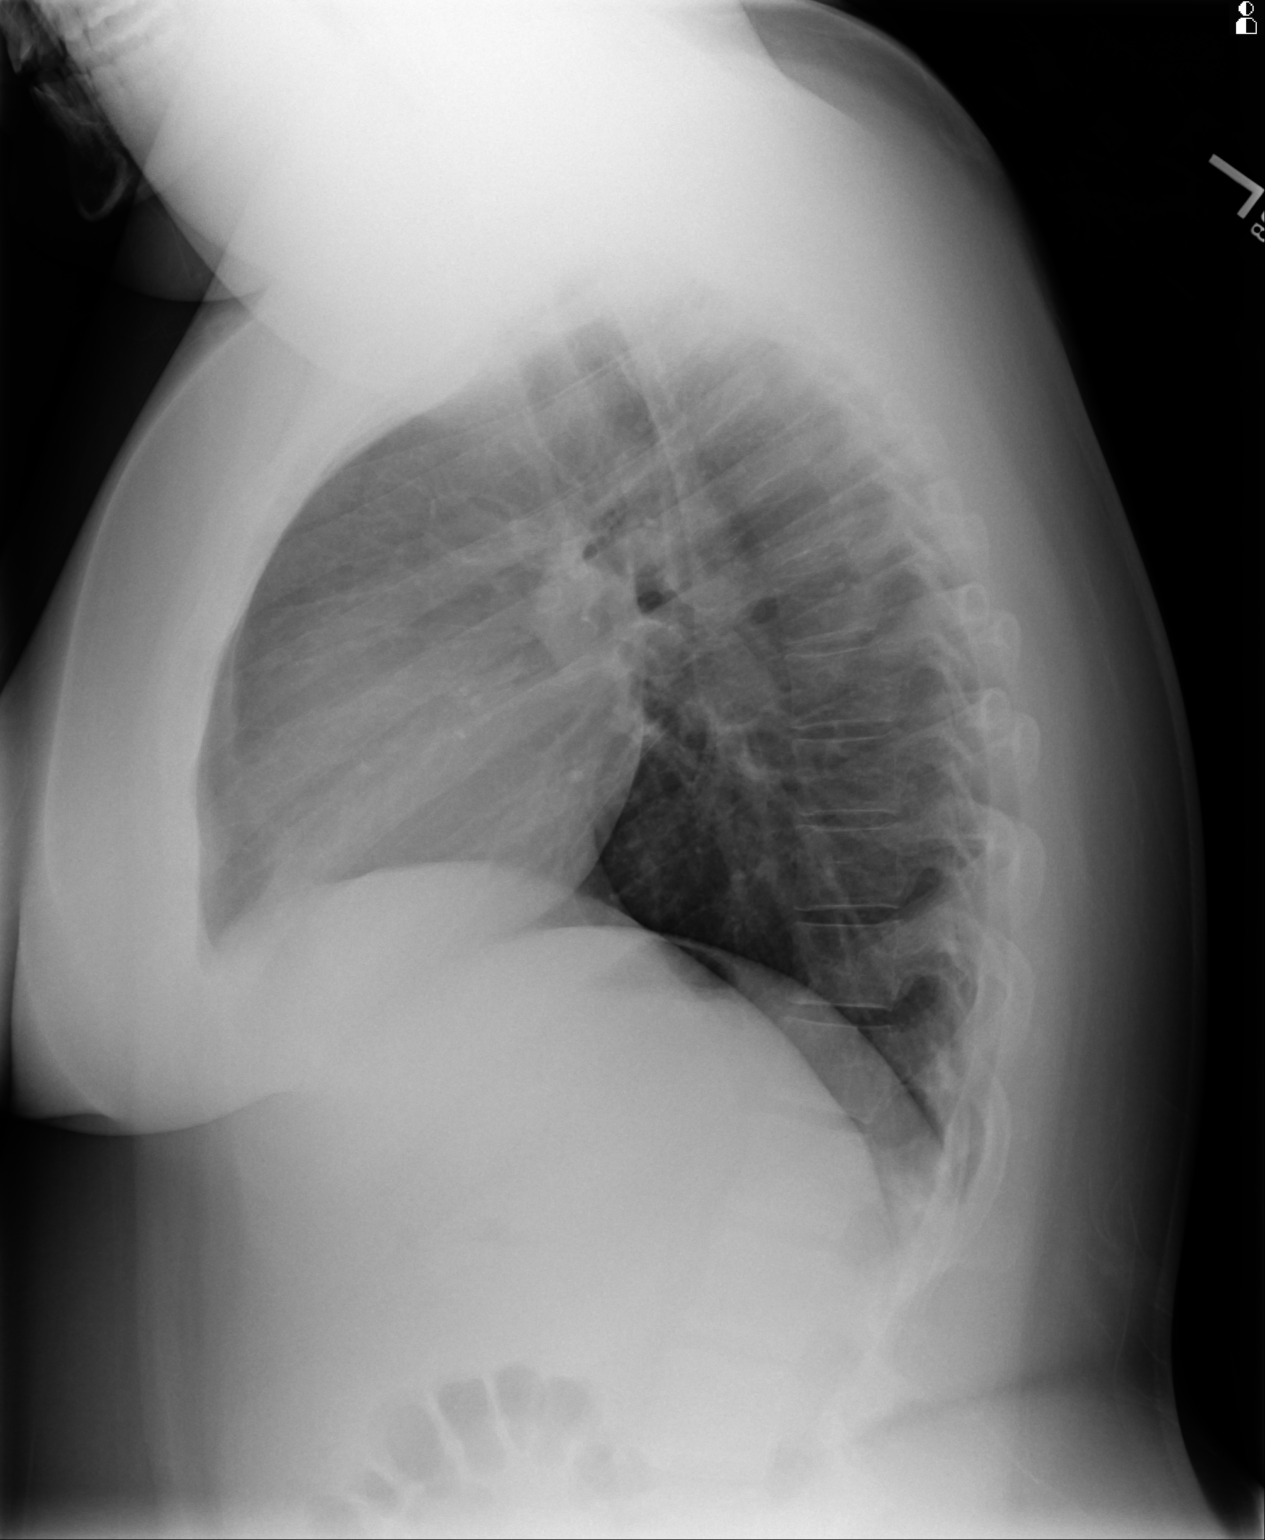

[2 of 2 positions shown; findings below may reference images not displayed]

IMPRESSION: 1. Chest radiograph without evidence of acute cardiopulmonary disease.
